# Patient Record
Sex: Male | Born: 1985 | Race: Black or African American | Hispanic: No | Marital: Married | Smoking: Never smoker
Health system: Southern US, Community
[De-identification: ages and names within clinical notes are randomized; demographics above are authoritative.]

## PROBLEM LIST (undated history)

## (undated) HISTORY — PX: EYE SURGERY: SHX253

---
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
## 2012-10-14 ENCOUNTER — Encounter (HOSPITAL_COMMUNITY): Payer: Self-pay | Admitting: Emergency Medicine

## 2012-10-14 ENCOUNTER — Emergency Department (HOSPITAL_COMMUNITY)

## 2012-10-14 ENCOUNTER — Emergency Department (HOSPITAL_COMMUNITY)
Admission: EM | Admit: 2012-10-14 | Discharge: 2012-10-15 | Disposition: A | Discharge status: Home / Self Care | Attending: Emergency Medicine | Admitting: Emergency Medicine

## 2012-10-14 DIAGNOSIS — R5381 Other malaise: Secondary | ICD-10-CM | POA: Insufficient documentation

## 2012-10-14 DIAGNOSIS — E860 Dehydration: Secondary | ICD-10-CM | POA: Insufficient documentation

## 2012-10-14 DIAGNOSIS — M545 Low back pain, unspecified: Secondary | ICD-10-CM | POA: Insufficient documentation

## 2012-10-14 DIAGNOSIS — R51 Headache: Secondary | ICD-10-CM | POA: Insufficient documentation

## 2012-10-14 DIAGNOSIS — R509 Fever, unspecified: Secondary | ICD-10-CM | POA: Insufficient documentation

## 2012-10-14 DIAGNOSIS — B349 Viral infection, unspecified: Secondary | ICD-10-CM

## 2012-10-14 DIAGNOSIS — B9789 Other viral agents as the cause of diseases classified elsewhere: Secondary | ICD-10-CM | POA: Insufficient documentation

## 2012-10-14 DIAGNOSIS — R5383 Other fatigue: Secondary | ICD-10-CM | POA: Insufficient documentation

## 2012-10-14 DIAGNOSIS — R519 Headache, unspecified: Secondary | ICD-10-CM

## 2012-10-14 DIAGNOSIS — M542 Cervicalgia: Secondary | ICD-10-CM | POA: Insufficient documentation

## 2012-10-14 DIAGNOSIS — H5710 Ocular pain, unspecified eye: Secondary | ICD-10-CM | POA: Insufficient documentation

## 2012-10-14 LAB — COMPREHENSIVE METABOLIC PANEL
Alkaline Phosphatase: 66 U/L (ref 39–117)
BUN: 6 mg/dL (ref 6–23)
CO2: 32 mEq/L (ref 19–32)
Chloride: 101 mEq/L (ref 96–112)
GFR calc Af Amer: 74 mL/min — ABNORMAL LOW (ref >90)
Glucose, Bld: 93 mg/dL (ref 70–99)
Potassium: 3.2 mEq/L — ABNORMAL LOW (ref 3.5–5.1)
Total Bilirubin: 0.6 mg/dL (ref 0.3–1.2)

## 2012-10-14 LAB — URINALYSIS, ROUTINE W REFLEX MICROSCOPIC
Ketones, ur: NEGATIVE mg/dL
Leukocytes, UA: NEGATIVE
Nitrite: NEGATIVE
Protein, ur: NEGATIVE mg/dL

## 2012-10-14 LAB — CBC WITH DIFFERENTIAL/PLATELET
Eosinophils Absolute: 0 10*3/uL (ref 0.0–0.7)
Hemoglobin: 14.6 g/dL (ref 13.0–17.0)
Lymphs Abs: 1.1 10*3/uL (ref 0.7–4.0)
MCH: 28.3 pg (ref 26.0–34.0)
Monocytes Relative: 16 % — ABNORMAL HIGH (ref 3–12)
Neutro Abs: 1.6 10*3/uL — ABNORMAL LOW (ref 1.7–7.7)
Neutrophils Relative %: 49 % (ref 43–77)
RBC: 5.16 MIL/uL (ref 4.22–5.81)

## 2012-10-14 MED ORDER — KETOROLAC TROMETHAMINE 30 MG/ML IJ SOLN
30.0000 mg | Freq: Once | INTRAMUSCULAR | Status: AC
  Administered 2012-10-14: 30 mg via INTRAVENOUS
  Filled 2012-10-14: qty 1

## 2012-10-14 MED ORDER — SODIUM CHLORIDE 0.9 % IV BOLUS (SEPSIS)
1000.0000 mL | Freq: Once | INTRAVENOUS | Status: AC
  Administered 2012-10-14: 1000 mL via INTRAVENOUS

## 2012-10-14 NOTE — ED Notes (Signed)
EDP at bedside  

## 2012-10-14 NOTE — ED Notes (Signed)
Pt reports bilateral leg, back and temporal head pain since Monday. Pt states he has lost approx 14lbs since Monday. Pt Reports increasing fatigue since Monday as well. Pt states his pain and diaphoresis increases around 1900 each day and peaks in pain at approx 2300. Pt denies NVD, CP, SOB.

## 2012-10-14 NOTE — ED Provider Notes (Signed)
History     CSN: [id number]  Arrival date & time 10/14/12  2050   First MD Initiated Contact with Patient 10/14/12 2119      Chief Complaint  Patient presents with  . Headache    (Consider location/radiation/quality/duration/timing/severity/associated sxs/prior treatment) Patient is a 27 y.o. male presenting with headaches. The history is provided by the patient.  Headache Pain location:  L temporal Quality:  Dull Radiates to:  Does not radiate Severity currently:  5/10 Severity at highest:  9/10 Onset quality:  Gradual Duration:  1 week Timing:  Constant Progression:  Unchanged Chronicity:  New Relieved by:  Nothing Exacerbated by: pt states worse when he spikes a fever.  Ineffective treatments: given motrin and tramadol. pt states he immediately went to sleep afterwards.  Associated symptoms: back pain (lumbar paraspinous), eye pain (intermittent when he would move his eyes. none now), fatigue, fever (only at night for past 5 days. up to 105 two days ago. ) and neck pain (mild pain with movement of his head. )   Associated symptoms: no abdominal pain, no blurred vision, no congestion, no cough, no diarrhea, no dizziness, no facial pain, no nausea, no neck stiffness, no photophobia, no sore throat, no URI, no visual change, no vomiting and no weakness     History reviewed. No pertinent past medical history.  Past Surgical History  Procedure Laterality Date  . Eye surgery      No family history on file.  History  Substance Use Topics  . Smoking status: Never Smoker   . Smokeless tobacco: Not on file  . Alcohol Use: No      Review of Systems  Constitutional: Positive for fever (only at night for past 5 days. up to 105 two days ago. ) and fatigue. Negative for chills.  HENT: Positive for neck pain (mild pain with movement of his head. ). Negative for congestion, sore throat, rhinorrhea and neck stiffness.   Eyes: Positive for pain (intermittent when he would move  his eyes. none now). Negative for blurred vision, photophobia, redness and visual disturbance.  Respiratory: Negative for cough, chest tightness and shortness of breath.   Cardiovascular: Negative for chest pain.  Gastrointestinal: Negative for nausea, vomiting, abdominal pain, diarrhea and constipation.  Genitourinary: Negative for dysuria.  Musculoskeletal: Positive for back pain (lumbar paraspinous). Negative for arthralgias.  Neurological: Positive for headaches. Negative for dizziness and weakness.  All other systems reviewed and are negative.    Allergies  Ceftriaxone  Home Medications   Current Outpatient Rx  Name  Route  Sig  Dispense  Refill  . ibuprofen (ADVIL,MOTRIN) 200 MG tablet   Oral   Take 400 mg by mouth every 6 (six) hours as needed for pain or headache.         . prednisoLONE acetate (PRED FORTE) 1 % ophthalmic suspension   Left Eye   Place 1 drop into the left eye 2 (two) times daily.           BP 124/72  Pulse 81  Temp(Src) 99.7 F (37.6 C) (Oral)  Resp 18  SpO2 99%  Physical Exam  Nursing note and vitals reviewed. Constitutional: He is oriented to person, place, and time. He appears well-developed and well-nourished. No distress.  HENT:  Head: Normocephalic and atraumatic.  Right Ear: External ear normal.  Left Ear: External ear normal.  Mouth/Throat: Oropharynx is clear and moist.  Eyes: Conjunctivae and EOM are normal. Pupils are equal, round, and reactive to light.  EOMs in tact with no palsy, no pain with movement.   Neck: Normal range of motion. Neck supple.  Full AROM with neck flex/ext and lateral rotation bilaterally. No midline TTP  Cardiovascular: Normal rate, regular rhythm, normal heart sounds and intact distal pulses.  Exam reveals no gallop and no friction rub.   No murmur heard. Pulmonary/Chest: Effort normal and breath sounds normal. No respiratory distress. He has no wheezes. He has no rales.  Abdominal: Soft. There is no  tenderness. There is no rebound and no guarding.  Musculoskeletal: Normal range of motion. He exhibits tenderness (mild TTP lower lumbar paraspinous muscles. no midline TTP). He exhibits no edema.  Lymphadenopathy:    He has no cervical adenopathy.  Neurological: He is alert and oriented to person, place, and time. He has normal strength. No cranial nerve deficit or sensory deficit. GCS eye subscore is 4. GCS verbal subscore is 5. GCS motor subscore is 6.  Skin: Skin is warm and dry. No rash noted. No erythema.  Psychiatric: He has a normal mood and affect. His behavior is normal.    ED Course  Procedures (including critical care time)  Labs Reviewed  CBC WITH DIFFERENTIAL - Abnormal; Notable for the following:    WBC 3.1 (*)    Platelets 146 (*)    Neutro Abs 1.6 (*)    Monocytes Relative 16 (*)    Basophils Relative 2 (*)    All other components within normal limits  COMPREHENSIVE METABOLIC PANEL - Abnormal; Notable for the following:    Potassium 3.2 (*)    Creatinine, Ser 1.46 (*)    AST 43 (*)    ALT 60 (*)    GFR calc non Af Amer 64 (*)    GFR calc Af Amer 74 (*)    All other components within normal limits  URINALYSIS, ROUTINE W REFLEX MICROSCOPIC - Abnormal; Notable for the following:    Hgb urine dipstick SMALL (*)    All other components within normal limits  URINE MICROSCOPIC-ADD ON   Dg Chest 2 View  10/14/2012   *RADIOLOGY REPORT*  Clinical Data: Headache, fever and myalgias.  CHEST - 2 VIEW  Comparison: None.  Findings: Normal sized heart.  Clear lungs.  Mild central peribronchial thickening.  Minimal scoliosis.  IMPRESSION: Mild bronchitic changes.   Original Report Authenticated By: Beckie Salts, M.D.   Ct Head Wo Contrast  10/14/2012   *RADIOLOGY REPORT*  Clinical Data:  Headache and dizziness.  CT HEAD WITHOUT CONTRAST  Technique: Contiguous axial images were obtained from the base of the skull through the vertex without contrast.  Comparison: None.  Findings:  Normal appearing cerebral hemispheres and posterior fossa structures.  Normal size and position of the ventricles.  No intracranial hemorrhage, mass lesion or evidence of acute infarction.  Unremarkable bones and included portions of the paranasal sinuses.  IMPRESSION: Normal examination.   Original Report Authenticated By: Beckie Salts, M.D.     1. Headache   2. Viral syndrome   3. Dehydration       MDM  65:61 PM 27 year old male with no significant past medical history presenting with 5 days of persistent right temporal headache, chills and fever at night as high as 1052 days ago. Patient states that his headache is worse when his fever spikes, but gradually improves. He endorses other vague complaints of his low back pain, cramping in his calves when he walks and intermittent pain when he moves his eyes. He appears well on  exam. He is afebrile here. No meningismus on exam. Given that he has no fever, symptoms for 5 days, and full range of motion of his neck do not feel this is an acute bacterial meningitis. He denies cough, nausea, vomiting or upper respiratory symptoms. He states in the past week he has lost 14 pounds. Unsure cause this he denies any volume loss from vomiting or diarrhea. Will check labs, urine, chest x-ray and CT head.  12:04 AM labs show ANC 1.6, however lower limit of normal is 1.7 and do not feel this is clinically significant. Creatinine slightly elevated likely from dehydration. Very mild elevation in LFTs but no abd pain and do not feel this is clinically significant. Pt appears well and non toxic. He has no meningismus on exam. Do not feel LP is indicated as it is very unlikely he has bacterial meningitis as he appears well with no fever and no meningismus. Pt given numbers to f/u with PCP if symptoms should persist. Will use tylenol and motrin as needed for fever. He voiced understanding, given return precautions and dc'd home in stable condition.       Caren Hazy,  MD 10/15/12 [phone]

## 2012-10-14 NOTE — ED Notes (Signed)
C/o constant headache since Monday that has progressively gotten worse.  Reports fevers at night, lower back pain, and pain to bilateral lower legs.  Also reports syncopal episode on Tuesday that pt relates to not eating.  14 lb weight loss since Monday.

## 2012-10-15 NOTE — ED Provider Notes (Signed)
I saw and evaluated the patient, reviewed the resident's note and I agree with the findings and plan.  Pt with headache, intermittent fevers for several days. No meningisums, benign exam. No other source and no travel history. Hydration and PCP recheck.   Charles B. Bernette Mayers, MD 10/15/12 1547

## 2012-10-15 NOTE — ED Notes (Signed)
Pt comfortable with d/c and f/u instructions. No prescriptions 

## 2012-10-17 ENCOUNTER — Ambulatory Visit (INDEPENDENT_AMBULATORY_CARE_PROVIDER_SITE_OTHER): Admitting: Emergency Medicine

## 2012-10-17 VITALS — BP 110/62 | HR 87 | Temp 99.3°F | Resp 16 | Ht 67.0 in | Wt 174.2 lb

## 2012-10-17 DIAGNOSIS — R509 Fever, unspecified: Secondary | ICD-10-CM

## 2012-10-17 LAB — POCT CBC
Granulocyte percent: 61.5 %G (ref 37–80)
HCT, POC: 48.6 % (ref 43.5–53.7)
Hemoglobin: 15.5 g/dL (ref 14.1–18.1)
Lymph, poc: 1.2 (ref 0.6–3.4)
MCH, POC: 27.7 pg (ref 27–31.2)
MCHC: 31.9 g/dL (ref 31.8–35.4)
MCV: 86.7 fL (ref 80–97)
MID (cbc): 0.4 (ref 0–0.9)
MPV: 10.1 fL (ref 0–99.8)
POC Granulocyte: 2.6 (ref 2–6.9)
POC LYMPH PERCENT: 28.9 %L (ref 10–50)
POC MID %: 9.6 %M (ref 0–12)
Platelet Count, POC: 174 10*3/uL (ref 142–424)
RBC: 5.6 M/uL (ref 4.69–6.13)
RDW, POC: 13.9 %
WBC: 4.3 10*3/uL — AB (ref 4.6–10.2)

## 2012-10-17 MED ORDER — DOXYCYCLINE HYCLATE 100 MG PO CAPS: 100.0000 mg | ORAL_CAPSULE | Freq: Two times a day (BID) | ORAL | Status: DC

## 2012-10-17 MED ORDER — HYDROCODONE-ACETAMINOPHEN 5-325 MG PO TABS: 1.0000 | ORAL_TABLET | ORAL | Status: AC | PRN

## 2012-10-17 NOTE — Progress Notes (Signed)
[facility] [address] [place] [address] [phone]  Date:  10/17/2012   Name:  Jeremiah Nelson   DOB:  07-Aug-1985   MRN:  [id number]  PCP:  No PCP Per Patient    Chief Complaint: Headache, Fever, Fatigue and Night Sweats   History of Present Illness:  Jeremiah Nelson is a 27 y.o. 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  Says the headache has been present since a week ago Sunday and has had a fever to "106".  Says the headache spreads from the right temporal area to across the forehead to include his whole head at night and is associated with photophobia.  No nausea or vomiting.  No significant stool change.  No rash.  No GU symptoms.  Some vertigo no neurologic symptoms.  Had normal CT in ER Saturday night and most his labs were normal at that time but he did have a low WBC (3.1) and a monocytosis.  Wife is a Teacher, early years/pre and is stationed at [employer].  He has no recent travel.  No improvement with over the counter medications or other home remedies. Denies other complaint or health concern today.   There are no active problems to display for this patient.   History reviewed. No pertinent past medical history.  Past Surgical History  Procedure Laterality Date  . Eye surgery      History  Substance Use Topics  . Smoking status: Never Smoker   . Smokeless tobacco: Not on file  . Alcohol Use: No    History reviewed. No pertinent family history.  Allergies  Allergen Reactions  . Ceftriaxone Other (See Comments)    unknown    Medication list has been reviewed and updated.  Current Outpatient Prescriptions on File Prior to Visit  Medication Sig Dispense Refill  . ibuprofen (ADVIL,MOTRIN) 200 MG tablet Take 400 mg by mouth every 6 (six) hours as needed for pain or headache.      . prednisoLONE acetate (PRED FORTE) 1 % ophthalmic suspension Place 1  drop into the left eye 2 (two) times daily.       No current facility-administered medications on file prior to visit.    Review of Systems:  As per HPI, otherwise negative.    Physical Examination: Filed Vitals:   10/17/12 1351  BP: 110/62  Pulse: 87  Temp: 99.3 F (37.4 C)  Resp: 16   Filed Vitals:   10/17/12 1351  Height: 5\' 7"  (1.702 m)  Weight: 174 lb 3.2 oz (79.017 kg)   Body mass index is 27.28 kg/(m^2). Ideal Body Weight: Weight in (lb) to have BMI = 25: 159.3  GEN: WDWN, NAD, Non-toxic, A & O x 3 HEENT: Atraumatic, Normocephalic. Neck supple. No masses, No LAD. Ears and Nose: No external deformity. CV: RRR, No M/G/R. No JVD. No thrill. No extra heart sounds. PULM: CTA B, no wheezes, crackles, rhonchi. No retractions. No resp. distress. No accessory muscle use. ABD: S, NT, ND, +BS. No rebound. No HSM. EXTR: No c/c/e NEURO Normal gait.  PSYCH: Normally interactive. Conversant. Not depressed or anxious appearing.  Calm demeanor.    Assessment and Plan: Headache and fever.  No evidence for meningitis Elevated transaminase in ER with low WBC suggests viral etiology. Will check labs Follow up after labs  Signed,  Phillips Odor, MD   Results for orders placed  in visit on 10/17/12  POCT CBC      Result Value Range   WBC 4.3 (*) 4.6 - 10.2 K/uL   Lymph, poc 1.2  0.6 - 3.4   POC LYMPH PERCENT 28.9  10 - 50 %L   MID (cbc) 0.4  0 - 0.9   POC MID % 9.6  0 - 12 %M   POC Granulocyte 2.6  2 - 6.9   Granulocyte percent 61.5  37 - 80 %G   RBC 5.60  4.69 - 6.13 M/uL   Hemoglobin 15.5  14.1 - 18.1 g/dL   HCT, POC 16.1  09.6 - 53.7 %   MCV 86.7  80 - 97 fL   MCH, POC 27.7  27 - 31.2 pg   MCHC 31.9  31.8 - 35.4 g/dL   RDW, POC 04.5     Platelet Count, POC 174  142 - 424 K/uL   MPV 10.1  0 - 99.8 fL

## 2012-10-17 NOTE — Patient Instructions (Addendum)
General Headache Without Cause A headache is pain or discomfort felt around the head or neck area. The specific cause of a headache may not be found. There are many causes and types of headaches. A few common ones are:  Tension headaches.  Migraine headaches.  Cluster headaches.  Chronic daily headaches. HOME CARE INSTRUCTIONS   Keep all follow-up appointments with your caregiver or any specialist referral.  Only take over-the-counter or prescription medicines for pain or discomfort as directed by your caregiver.  Lie down in a dark, quiet room when you have a headache.  Keep a headache journal to find out what may trigger your migraine headaches. For example, write down:  What you eat and drink.  How much sleep you get.  Any change to your diet or medicines.  Try massage or other relaxation techniques.  Put ice packs or heat on the head and neck. Use these 3 to 4 times per day for 15 to 20 minutes each time, or as needed.  Limit stress.  Sit up straight, and do not tense your muscles.  Quit smoking if you smoke.  Limit alcohol use.  Decrease the amount of caffeine you drink, or stop drinking caffeine.  Eat and sleep on a regular schedule.  Get 7 to 9 hours of sleep, or as recommended by your caregiver.  Keep lights dim if bright lights bother you and make your headaches worse. SEEK MEDICAL CARE IF:   You have problems with the medicines you were prescribed.  Your medicines are not working.  You have a change from the usual headache.  You have nausea or vomiting. SEEK IMMEDIATE MEDICAL CARE IF:   Your headache becomes severe.  You have a fever.  You have a stiff neck.  You have loss of vision.  You have muscular weakness or loss of muscle control.  You start losing your balance or have trouble walking.  You feel faint or pass out.  You have severe symptoms that are different from your first symptoms. MAKE SURE YOU:   Understand these  instructions.  Will watch your condition.  Will get help right away if you are not doing well or get worse. Document Released: 04/20/2005 Document Revised: 07/13/2011 Document Reviewed: 05/06/2011 [facility] Patient Information 2014 [place], [place]. Fever  Fever is a higher-than-normal body temperature. A normal temperature varies with:  Age.  How it is measured (mouth, underarm, rectal, or ear).  Time of day. In an adult, an oral temperature around 98.6 Fahrenheit (F) or 37 Celsius (C) is considered normal. A rise in temperature of about 1.8 F or 1 C is generally considered a fever (100.4 F or 38 C). In an infant age 3 days or less, a rectal temperature of 100.4 F (38 C) generally is regarded as fever. Fever is not a disease but can be a symptom of illness. CAUSES   Fever is most commonly caused by infection.  Some non-infectious problems can cause fever. For example:  Some arthritis problems.  Problems with the thyroid or adrenal glands.  Immune system problems.  Some kinds of cancer.  A reaction to certain medicines.  Occasionally, the source of a fever cannot be determined. This is sometimes called a "Fever of Unknown Origin" (FUO).  Some situations may lead to a temporary rise in body temperature that may go away on its own. Examples are:  Childbirth.  Surgery.  Some situations may cause a rise in body temperature but these are not considered "true fever". Examples are:  Intense exercise.  Dehydration.  Exposure to high outside or room temperatures. SYMPTOMS   Feeling warm or hot.  Fatigue or feeling exhausted.  Aching all over.  Chills.  Shivering.  Sweats. DIAGNOSIS  A fever can be suspected by your caregiver feeling that your skin is unusually warm. The fever is confirmed by taking a temperature with a thermometer. Temperatures can be taken different ways. Some methods are accurate and some are not: With adults, adolescents, and children:     An oral temperature is used most commonly.  An ear thermometer will only be accurate if it is positioned as recommended by the manufacturer.  Under the arm temperatures are not accurate and not recommended.  Most electronic thermometers are fast and accurate. Infants and Toddlers:  Rectal temperatures are recommended and most accurate.  Ear temperatures are not accurate in this age group and are not recommended.  Skin thermometers are not accurate. RISKS AND COMPLICATIONS   During a fever, the body uses more oxygen, so a person with a fever may develop rapid breathing or shortness of breath. This can be dangerous especially in people with heart or lung disease.  The sweats that occur following a fever can cause dehydration.  High fever can cause seizures in infants and children.  Older persons can develop confusion during a fever. TREATMENT   Medications may be used to control temperature.  Do not give aspirin to children with fevers. There is an association with Reye's syndrome. Reye's syndrome is a rare but potentially deadly disease.  If an infection is present and medications have been prescribed, take them as directed. Finish the full course of medications until they are gone.  Sponging or bathing with room-temperature water may help reduce body temperature. Do not use ice water or alcohol sponge baths.  Do not over-bundle children in blankets or heavy clothes.  Drinking adequate fluids during an illness with fever is important to prevent dehydration. HOME CARE INSTRUCTIONS   For adults, rest and adequate fluid intake are important. Dress according to how you feel, but do not over-bundle.  Drink enough water and/or fluids to keep your urine clear or pale yellow.  For infants over 3 months and children, giving medication as directed by your caregiver to control fever can help with comfort. The amount to be given is based on the child's weight. Do NOT give more than  is recommended. SEEK MEDICAL CARE IF:   You or your child are unable to keep fluids down.  Vomiting or diarrhea develops.  You develop a skin rash.  An oral temperature above 102 F (38.9 C) develops, or a fever which persists for over 3 days.  You develop excessive weakness, dizziness, fainting or extreme thirst.  Fevers keep coming back after 3 days. SEEK IMMEDIATE MEDICAL CARE IF:   Shortness of breath or trouble breathing develops  You pass out.  You feel you are making little or no urine.  New pain develops that was not there before (such as in the head, neck, chest, back, or abdomen).  You cannot hold down fluids.  Vomiting and diarrhea persist for more than a day or two.  You develop a stiff neck and/or your eyes become sensitive to light.  An unexplained temperature above 102 F (38.9 C) develops. Document Released: 04/20/2005 Document Revised: 07/13/2011 Document Reviewed: 04/05/2008 [facility] Patient Information 2014 [place], [place].

## 2012-10-18 ENCOUNTER — Other Ambulatory Visit: Payer: Self-pay | Admitting: *Deleted

## 2012-10-18 LAB — COMPREHENSIVE METABOLIC PANEL
ALT: 97 U/L — ABNORMAL HIGH (ref 0–53)
AST: 73 U/L — ABNORMAL HIGH (ref 0–37)
Alkaline Phosphatase: 67 U/L (ref 39–117)
BUN: 6 mg/dL (ref 6–23)
Calcium: 9.6 mg/dL (ref 8.4–10.5)
Chloride: 100 mEq/L (ref 96–112)
Creat: 1.39 mg/dL — ABNORMAL HIGH (ref 0.50–1.35)
Total Bilirubin: 0.7 mg/dL (ref 0.3–1.2)

## 2012-10-18 LAB — B. BURGDORFI ANTIBODIES: B burgdorferi Ab IgG+IgM: 1.11 {ISR} — ABNORMAL HIGH

## 2012-10-18 LAB — EPSTEIN-BARR VIRUS VCA ANTIBODY PANEL
EBV EA IgG: <5 U/mL (ref <9.0)
EBV NA IgG: 537 U/mL — ABNORMAL HIGH (ref <18.0)
EBV VCA IgM: <10 U/mL (ref <36.0)

## 2012-10-18 LAB — CYTOMEGALOVIRUS ANTIBODY, IGG: Cytomegalovirus Ab-IgG: 8.7 U/mL — ABNORMAL HIGH (ref <0.60)

## 2012-10-18 MED ORDER — DOXYCYCLINE HYCLATE 100 MG PO CAPS: 100.0000 mg | ORAL_CAPSULE | Freq: Two times a day (BID) | ORAL | Status: DC

## 2012-10-20 LAB — OTHER SOLSTAS TEST
B burgdorferi IgG Abs (IB): NEGATIVE
B burgdorferi IgM Abs (IB): NEGATIVE
Lyme Disease 18 kD IgG: NONREACTIVE
Lyme Disease 23 kD IgG: NONREACTIVE
Lyme Disease 23 kD IgM: NONREACTIVE
Lyme Disease 28 kD IgG: NONREACTIVE
Lyme Disease 30 kD IgG: NONREACTIVE
Lyme Disease 39 kD IgG: REACTIVE — AB
Lyme Disease 39 kD IgM: NONREACTIVE
Lyme Disease 41 kD IgM: NONREACTIVE

## 2012-10-29 ENCOUNTER — Telehealth: Payer: Self-pay

## 2012-10-29 NOTE — Telephone Encounter (Signed)
Pt is returning a call he believes it could be about labs Call back number [phone]

## 2012-10-31 NOTE — Telephone Encounter (Signed)
Patient has been notified of labs.

## 2012-12-22 ENCOUNTER — Telehealth: Payer: Self-pay

## 2012-12-22 NOTE — Telephone Encounter (Signed)
Tammy at [facility] is checking on the status of their request of paperwork on this patient.   [phone]

## 2012-12-22 NOTE — Telephone Encounter (Signed)
Faxed

## 2015-10-02 ENCOUNTER — Encounter (INDEPENDENT_AMBULATORY_CARE_PROVIDER_SITE_OTHER): Payer: Self-pay

## 2015-10-03 ENCOUNTER — Encounter (INDEPENDENT_AMBULATORY_CARE_PROVIDER_SITE_OTHER): Payer: Self-pay | Admitting: Sports Medicine

## 2015-10-03 ENCOUNTER — Ambulatory Visit (INDEPENDENT_AMBULATORY_CARE_PROVIDER_SITE_OTHER): Payer: TRICARE Extra—PPO | Admitting: Sports Medicine

## 2015-10-03 VITALS — BP 118/66 | HR 80 | Ht 67.0 in | Wt 205.0 lb

## 2015-10-03 DIAGNOSIS — G8929 Other chronic pain: Secondary | ICD-10-CM

## 2015-10-03 DIAGNOSIS — M2392 Unspecified internal derangement of left knee: Secondary | ICD-10-CM

## 2015-10-03 DIAGNOSIS — M25562 Pain in left knee: Secondary | ICD-10-CM

## 2015-10-03 NOTE — Progress Notes (Signed)
Chief Complaint   Patient presents with   . Knee Pain     Left     HPI:  Russell Huffman is a 30 y.o.-year-old male who presents with w/o left knee pain onset about 8 years ago. He states that he suffered a hyperextension injury while playing basketball in 2009 and hearing a pop at the time. His pain is currently described as dull and aching. It is both anterior and posterior. It is getting worse. There is associated swelling, locking/catching, and giving way. His symptoms are worse with standing, lying in bed, kneeling and sitting. He has tried ice with minimal relief. 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 No records are currently available. He continues to have pain with activity when he doesn't have his knee brace.    PMH:  History reviewed. No pertinent past medical history.    Social History:   Social History   Substance Use Topics   . Smoking status: Never Smoker    . Smokeless tobacco: None   . Alcohol Use: No       Family History:    Family History   Problem Relation Age of Onset   . No known problems Mother    . No known problems Father    . No known problems Sister    . No known problems Brother    . No known problems Maternal Aunt    . No known problems Maternal Uncle    . No known problems Paternal Aunt    . No known problems Paternal Uncle    . No known problems Maternal Grandmother    . No known problems Maternal Grandfather    . No known problems Paternal Grandmother    . No known problems Paternal Grandfather    . Anesthesia problems Neg Hx    . Broken bones Neg Hx    . Cancer Neg Hx    . Clotting disorder Neg Hx    . Collagen disease Neg Hx    . Diabetes Neg Hx    . Dislocations Neg Hx    . Osteoporosis Neg Hx    . Rheumatologic disease Neg Hx    . Scoliosis Neg Hx    . Severe sprains Neg Hx    . Heart disease Neg Hx        Past Surgical History:  History reviewed. No pertinent past surgical history.    Medications:     Current outpatient prescriptions:   .  naproxen (NAPROSYN) 250 MG tablet, Take 250 mg by mouth 2 (two) times daily with meals., Disp: , Rfl:   .  bacitracin zinc ointment, , Disp: , Rfl:   .  ibuprofen (ADVIL,MOTRIN) 800 MG tablet, , Disp: , Rfl:     Allergies:  No Known Allergies    ROS:   All other systems were reviewed and are negative except as previously mentioned in the HPI.    EXAM:   BP 118/66 mmHg  Pulse 80  Ht 1.702 m (5\' 7" )  Wt 92.987 kg (205 lb)  BMI 32.10 kg/m2  Constitutional: Pt is well-developed, well-nourished, and in no distress.   HENT:   Head: Normocephalic and atraumatic.   Eyes: Conjunctivae are normal.   Neck: Neck supple.   Cardiovascular: Normal rate  Pulmonary/Chest: Effort normal.   Neurological: Pt is alert and oriented to person, place, and time.   Skin:  Skin is warm and dry. No rash noted. Pt is not diaphoretic.   Psychiatric: Affect normal.     Left Knee  Observation:  No erythema, deformity, warmth, or ecchymosis; Smooth, reciprocal, non-antalgic gait; No genu varus or valgus deformity or excessive recurvatum   Effusion:  none  Range of Motion:  0 to 140 Deg; No pain at extremes of ROM  Joint Line Tenderness: medial and lateral  Tenderness:  No tenderness over tibial tubercle, patellar tendon, prepatellar bursa, infrapatellar bursa, pes anserine bursa, medial retinacular area, lateral retinacular area, at Gerdy's tubercle, lateral femoral epicondyle, quadriceps tendon  Strength: extensor mechanism intact  Patella:  Yes crepitus with active ROM;  Grind: negative; nontender at proximal pole, inferior pole, medial facet, lateral facet  Patellar Glide:  normal  Plica:  None  Patellar Apprehension:  negative  Lachman: negative  Anterior Drawer:  negative  Valgus Stress for MCL:  negative   Varus Stress for LCL:  negative  Posterior Drawer:  negative  McMurray:  Negative  Distal neurovascular intact    STUDIES: no records are currently available    ASSESSMENT/PLAN:   1. Chronic pain  of left knee  MRI Knee Left  WO Contrast   2. Knee locking, left  MRI Knee Left  WO Contrast     30 yo male with chronic left knee pain with mechanical symptoms. I have recommended obtaining an MRI to further evaluate the state of his articular cartilage and to assess for any loose bodies or meniscal tears. His PCL appears to be intact based on today's exam. Treatment options discussed with patient at length, including risks, benefits, alternatives, and the nature of any potential procedures for the problem.    Recommendations:  Relative rest and activity modification  ice / heat as needed for comfort  Over the counter Tylenol as needed  MRI left knee  Lower impact activity encouraged  Follow up once MRI complete or sooner as needed if symptoms worsen  May consider visco vs PRP in the future as clinically indicated    Teena Dunk, MD [facility]  Primary Care Sports Medicine Physician  [facility]

## 2019-02-05 ENCOUNTER — Emergency Department (HOSPITAL_COMMUNITY)
Admission: EM | Admit: 2019-02-05 | Discharge: 2019-02-05 | Disposition: A | Discharge status: Home / Self Care | Attending: Emergency Medicine | Admitting: Emergency Medicine

## 2019-02-05 ENCOUNTER — Other Ambulatory Visit: Payer: Self-pay

## 2019-02-05 ENCOUNTER — Emergency Department (HOSPITAL_COMMUNITY)

## 2019-02-05 ENCOUNTER — Encounter (HOSPITAL_COMMUNITY): Payer: Self-pay | Admitting: Emergency Medicine

## 2019-02-05 DIAGNOSIS — R519 Headache, unspecified: Secondary | ICD-10-CM | POA: Insufficient documentation

## 2019-02-05 DIAGNOSIS — R2241 Localized swelling, mass and lump, right lower limb: Secondary | ICD-10-CM | POA: Diagnosis not present

## 2019-02-05 DIAGNOSIS — Y92410 Unspecified street and highway as the place of occurrence of the external cause: Secondary | ICD-10-CM | POA: Diagnosis not present

## 2019-02-05 DIAGNOSIS — S0003XA Contusion of scalp, initial encounter: Secondary | ICD-10-CM | POA: Insufficient documentation

## 2019-02-05 DIAGNOSIS — M542 Cervicalgia: Secondary | ICD-10-CM | POA: Diagnosis not present

## 2019-02-05 DIAGNOSIS — Y999 Unspecified external cause status: Secondary | ICD-10-CM | POA: Diagnosis not present

## 2019-02-05 DIAGNOSIS — M533 Sacrococcygeal disorders, not elsewhere classified: Secondary | ICD-10-CM | POA: Diagnosis not present

## 2019-02-05 DIAGNOSIS — Z79899 Other long term (current) drug therapy: Secondary | ICD-10-CM | POA: Diagnosis not present

## 2019-02-05 DIAGNOSIS — S0083XA Contusion of other part of head, initial encounter: Secondary | ICD-10-CM | POA: Diagnosis not present

## 2019-02-05 DIAGNOSIS — R55 Syncope and collapse: Secondary | ICD-10-CM | POA: Diagnosis not present

## 2019-02-05 DIAGNOSIS — S0990XA Unspecified injury of head, initial encounter: Secondary | ICD-10-CM | POA: Diagnosis present

## 2019-02-05 DIAGNOSIS — Y9389 Activity, other specified: Secondary | ICD-10-CM | POA: Diagnosis not present

## 2019-02-05 DIAGNOSIS — M25512 Pain in left shoulder: Secondary | ICD-10-CM | POA: Diagnosis not present

## 2019-02-05 MED ORDER — IBUPROFEN 400 MG PO TABS
600.0000 mg | ORAL_TABLET | Freq: Once | ORAL | Status: DC
  Filled 2019-02-05: qty 1

## 2019-02-05 MED ORDER — ACETAMINOPHEN 325 MG PO TABS
650.0000 mg | ORAL_TABLET | Freq: Once | ORAL | Status: DC
  Filled 2019-02-05: qty 2
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
## 2019-02-05 NOTE — ED Notes (Signed)
Patient verbalizes understanding of discharge instructions. Opportunity for questioning and answers were provided. Pt discharged from ED. 

## 2019-02-05 NOTE — ED Notes (Signed)
Pt states he would like to eat something prior to taking medicine; pt given food and beverage

## 2019-02-05 NOTE — ED Notes (Signed)
Patient transported to X-ray 

## 2020-04-07 ENCOUNTER — Other Ambulatory Visit: Payer: Self-pay

## 2020-04-07 ENCOUNTER — Ambulatory Visit (HOSPITAL_COMMUNITY)
Admission: EM | Admit: 2020-04-07 | Discharge: 2020-04-07 | Disposition: A | Discharge status: Home / Self Care | Attending: Emergency Medicine | Admitting: Emergency Medicine

## 2020-04-07 ENCOUNTER — Encounter (HOSPITAL_COMMUNITY): Payer: Self-pay | Admitting: *Deleted

## 2020-04-07 DIAGNOSIS — N342 Other urethritis: Secondary | ICD-10-CM | POA: Diagnosis not present

## 2020-04-07 MED ORDER — CEFTRIAXONE SODIUM 500 MG IJ SOLR
500.0000 mg | Freq: Once | INTRAMUSCULAR | Status: AC
  Administered 2020-04-07: 500 mg via INTRAMUSCULAR

## 2020-04-07 MED ORDER — CEFTRIAXONE SODIUM 500 MG IJ SOLR
INTRAMUSCULAR | Status: AC
  Filled 2020-04-07: qty 500

## 2020-04-07 MED ORDER — LIDOCAINE HCL (PF) 1 % IJ SOLN
INTRAMUSCULAR | Status: AC
  Filled 2020-04-07: qty 2

## 2020-04-07 MED ORDER — DOXYCYCLINE HYCLATE 100 MG PO CAPS: 100.0000 mg | ORAL_CAPSULE | Freq: Two times a day (BID) | ORAL | 0 refills | Status: AC

## 2020-04-07 NOTE — Discharge Instructions (Signed)
Complete course of antibiotics.  We will notify of you any positive findings or if any changes to treatment are needed. If normal or otherwise without concern to your results, we will not call you. Please log on to your MyChart to review your results if interested in so.

## 2020-04-07 NOTE — ED Triage Notes (Signed)
Pt reports he has had recurrent  Urethritis .

## 2020-04-07 NOTE — ED Provider Notes (Signed)
[facility]    CSN: [id number] Arrival date & time: 04/07/20  1439      History   Chief Complaint Chief Complaint  Patient presents with   Exposure to STD    HPI Jeremiah Nelson is a 34 y.o. male.   Jeremiah Nelson presents with complaints of urethral pain, which feels similar to urethritis he has had in the past- on chart review last in July of this year. No dysuria or polyuria. Denies any penile discharge. Denies pelvic pain, back pain, or hematuria. Sexually active with one partner, doesn't use condoms. No specific known concern about std's at this time. Negative testing with last episode in July, but symptoms resolved after treatment with antibiotics provided. Pain is constant sensation to urethra.    ROS per HPI, negative if not otherwise mentioned.      History reviewed. No pertinent past medical history.  There are no problems to display for this patient.   Past Surgical History:  Procedure Laterality Date   EYE SURGERY         Home Medications    Prior to Admission medications   Medication Sig Start Date End Date Taking? Authorizing Provider  doxycycline (VIBRAMYCIN) 100 MG capsule Take 1 capsule (100 mg total) by mouth 2 (two) times daily for 7 days. 04/07/20 04/14/20  Georgetta Haber, NP  HYDROcodone-acetaminophen (NORCO) 5-325 MG per tablet Take 1-2 tablets by mouth every 4 (four) hours as needed for pain. Patient not taking: Reported on 02/05/2019 10/17/12   Carmelina Dane, MD  prednisoLONE acetate (PRED FORTE) 1 % ophthalmic suspension Place 1 drop into the left eye 2 (two) times daily.    [provider]    Family History History reviewed. No pertinent family history.  Social History Social History   Tobacco Use   Smoking status: Never Smoker   Smokeless tobacco: Never Used  Substance Use Topics   Alcohol use: No   Drug use: No     Allergies   Patient has no known allergies.   Review of Systems Review of  Systems   Physical Exam Triage Vital Signs ED Triage Vitals  Enc Vitals Group     BP 04/07/20 1514 (!) 142/71     Pulse Rate 04/07/20 1514 (!) 53     Resp 04/07/20 1514 16     Temp 04/07/20 1514 98.1 F (36.7 C)     Temp Source 04/07/20 1514 Oral     SpO2 04/07/20 1514 96 %     Weight 04/07/20 1515 200 lb (90.7 kg)     Height 04/07/20 1515 5\' 8"  (1.727 m)     Head Circumference --      Peak Flow --      Pain Score 04/07/20 1514 1     Pain Loc --      Pain Edu? --      Excl. in GC? --    No data found.  Updated Vital Signs BP (!) 142/71 (BP Location: Right Arm)    Pulse (!) 53    Temp 98.1 F (36.7 C) (Oral)    Resp 16    Ht 5\' 8"  (1.727 m)    Wt 200 lb (90.7 kg)    SpO2 96%    BMI 30.41 kg/m   Visual Acuity Right Eye Distance:   Left Eye Distance:   Bilateral Distance:    Right Eye Near:   Left Eye Near:    Bilateral Near:     Physical  Exam Constitutional:      Appearance: He is well-developed.  Cardiovascular:     Rate and Rhythm: Normal rate.  Pulmonary:     Effort: Pulmonary effort is normal.  Abdominal:     Palpations: Abdomen is not rigid.     Tenderness: Negative signs include Murphy's sign and McBurney's sign.     Comments: Denies scrotal redness, swelling, pain; denies sores or lesions; gu exam deferred   Skin:    General: Skin is warm and dry.  Neurological:     Mental Status: He is alert and oriented to person, place, and time.      UC Treatments / Results  Labs (all labs ordered are listed, but only abnormal results are displayed) Labs Reviewed  CYTOLOGY, (ORAL, ANAL, URETHRAL) ANCILLARY ONLY    EKG   Radiology No results found.  Procedures Procedures (including critical care time)  Medications Ordered in UC Medications  cefTRIAXone (ROCEPHIN) injection 500 mg (has no administration in time range)    Initial Impression / Assessment and Plan / UC Course  I have reviewed the triage vital signs and the nursing notes.  Pertinent  labs & imaging results that were available during my care of the patient were reviewed by me and considered in my medical decision making (see chart for details).     Rocephin and doxycycline provided with penile cytology pending. Return precautions provided. Patient verbalized understanding and agreeable to plan.   Final Clinical Impressions(s) / UC Diagnoses   Final diagnoses:  Urethritis     Discharge Instructions     Complete course of antibiotics.  We will notify of you any positive findings or if any changes to treatment are needed. If normal or otherwise without concern to your results, we will not call you. Please log on to your MyChart to review your results if interested in so.     ED Prescriptions    Medication Sig Dispense Auth. Provider   doxycycline (VIBRAMYCIN) 100 MG capsule Take 1 capsule (100 mg total) by mouth 2 (two) times daily for 7 days. 14 capsule Georgetta Haber, NP     PDMP not reviewed this encounter.   Georgetta Haber, NP 04/07/20 1553

## 2020-04-08 LAB — CYTOLOGY, (ORAL, ANAL, URETHRAL) ANCILLARY ONLY
Chlamydia: NEGATIVE
Comment: NEGATIVE
Comment: NEGATIVE
Comment: NORMAL
Neisseria Gonorrhea: NEGATIVE
Trichomonas: NEGATIVE
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
